# Patient Record
Sex: Female | Born: 1955 | Race: White | Hispanic: No | Marital: Married | Smoking: Never smoker
Health system: Southern US, Community
[De-identification: ages and names within clinical notes are randomized; demographics above are authoritative.]

---

## 2019-07-08 DIAGNOSIS — L718 Other rosacea: Secondary | ICD-10-CM | POA: Diagnosis not present

## 2019-07-08 DIAGNOSIS — D225 Melanocytic nevi of trunk: Secondary | ICD-10-CM | POA: Diagnosis not present

## 2019-07-08 DIAGNOSIS — Z85828 Personal history of other malignant neoplasm of skin: Secondary | ICD-10-CM | POA: Diagnosis not present

## 2019-07-08 DIAGNOSIS — B009 Herpesviral infection, unspecified: Secondary | ICD-10-CM | POA: Diagnosis not present

## 2019-07-08 DIAGNOSIS — L820 Inflamed seborrheic keratosis: Secondary | ICD-10-CM | POA: Diagnosis not present

## 2019-07-08 DIAGNOSIS — Z1283 Encounter for screening for malignant neoplasm of skin: Secondary | ICD-10-CM | POA: Diagnosis not present

## 2019-07-08 DIAGNOSIS — L249 Irritant contact dermatitis, unspecified cause: Secondary | ICD-10-CM | POA: Diagnosis not present

## 2019-07-08 DIAGNOSIS — D229 Melanocytic nevi, unspecified: Secondary | ICD-10-CM | POA: Diagnosis not present

## 2019-07-08 DIAGNOSIS — Z85820 Personal history of malignant melanoma of skin: Secondary | ICD-10-CM | POA: Diagnosis not present

## 2019-07-08 DIAGNOSIS — L578 Other skin changes due to chronic exposure to nonionizing radiation: Secondary | ICD-10-CM | POA: Diagnosis not present

## 2019-09-01 DIAGNOSIS — B0052 Herpesviral keratitis: Secondary | ICD-10-CM | POA: Diagnosis not present

## 2019-12-05 DIAGNOSIS — H524 Presbyopia: Secondary | ICD-10-CM | POA: Diagnosis not present

## 2019-12-05 DIAGNOSIS — H5213 Myopia, bilateral: Secondary | ICD-10-CM | POA: Diagnosis not present

## 2020-02-12 ENCOUNTER — Ambulatory Visit: Payer: Self-pay

## 2020-09-16 DIAGNOSIS — Z20822 Contact with and (suspected) exposure to covid-19: Secondary | ICD-10-CM | POA: Diagnosis not present

## 2021-05-26 DIAGNOSIS — M222X9 Patellofemoral disorders, unspecified knee: Secondary | ICD-10-CM | POA: Diagnosis not present

## 2021-05-26 DIAGNOSIS — S86811A Strain of other muscle(s) and tendon(s) at lower leg level, right leg, initial encounter: Secondary | ICD-10-CM | POA: Diagnosis not present

## 2021-10-20 DIAGNOSIS — H52213 Irregular astigmatism, bilateral: Secondary | ICD-10-CM | POA: Diagnosis not present

## 2021-10-20 DIAGNOSIS — H524 Presbyopia: Secondary | ICD-10-CM | POA: Diagnosis not present

## 2021-10-20 DIAGNOSIS — H5213 Myopia, bilateral: Secondary | ICD-10-CM | POA: Diagnosis not present

## 2022-04-21 DIAGNOSIS — H90A31 Mixed conductive and sensorineural hearing loss, unilateral, right ear with restricted hearing on the contralateral side: Secondary | ICD-10-CM | POA: Diagnosis not present

## 2022-04-21 DIAGNOSIS — H65111 Acute and subacute allergic otitis media (mucoid) (sanguinous) (serous), right ear: Secondary | ICD-10-CM | POA: Diagnosis not present

## 2022-04-21 DIAGNOSIS — H6122 Impacted cerumen, left ear: Secondary | ICD-10-CM | POA: Diagnosis not present

## 2022-05-15 ENCOUNTER — Emergency Department: Payer: 59

## 2022-05-15 ENCOUNTER — Emergency Department
Admission: EM | Admit: 2022-05-15 | Discharge: 2022-05-15 | Disposition: A | Discharge status: Home / Self Care | Payer: 59 | Attending: Emergency Medicine | Admitting: Emergency Medicine

## 2022-05-15 ENCOUNTER — Other Ambulatory Visit: Payer: Self-pay

## 2022-05-15 ENCOUNTER — Encounter: Payer: Self-pay | Admitting: Emergency Medicine

## 2022-05-15 DIAGNOSIS — R251 Tremor, unspecified: Secondary | ICD-10-CM | POA: Insufficient documentation

## 2022-05-15 DIAGNOSIS — R519 Headache, unspecified: Secondary | ICD-10-CM | POA: Diagnosis not present

## 2022-05-15 DIAGNOSIS — R531 Weakness: Secondary | ICD-10-CM | POA: Insufficient documentation

## 2022-05-15 DIAGNOSIS — R000 Tachycardia, unspecified: Secondary | ICD-10-CM | POA: Diagnosis not present

## 2022-05-15 LAB — URINALYSIS, ROUTINE W REFLEX MICROSCOPIC
Bilirubin Urine: NEGATIVE
Glucose, UA: NEGATIVE mg/dL
Ketones, ur: NEGATIVE mg/dL
Leukocytes,Ua: NEGATIVE
Nitrite: NEGATIVE
Protein, ur: NEGATIVE mg/dL
Specific Gravity, Urine: 1.005 (ref 1.005–1.030)
pH: 8 (ref 5.0–8.0)

## 2022-05-15 LAB — BASIC METABOLIC PANEL
Anion gap: 10 (ref 5–15)
BUN: 12 mg/dL (ref 8–23)
CO2: 23 mmol/L (ref 22–32)
Calcium: 9.1 mg/dL (ref 8.9–10.3)
Chloride: 104 mmol/L (ref 98–111)
Creatinine, Ser: 0.74 mg/dL (ref 0.44–1.00)
GFR, Estimated: >60 mL/min (ref >60)
Glucose, Bld: 154 mg/dL — ABNORMAL HIGH (ref 70–99)
Potassium: 3.8 mmol/L (ref 3.5–5.1)
Sodium: 137 mmol/L (ref 135–145)

## 2022-05-15 LAB — CBC
HCT: 46.8 % — ABNORMAL HIGH (ref 36.0–46.0)
Hemoglobin: 15.2 g/dL — ABNORMAL HIGH (ref 12.0–15.0)
MCH: 29.3 pg (ref 26.0–34.0)
MCHC: 32.5 g/dL (ref 30.0–36.0)
MCV: 90.2 fL (ref 80.0–100.0)
Platelets: 295 10*3/uL (ref 150–400)
RBC: 5.19 MIL/uL — ABNORMAL HIGH (ref 3.87–5.11)
RDW: 12.9 % (ref 11.5–15.5)
WBC: 5 10*3/uL (ref 4.0–10.5)
nRBC: 0 % (ref 0.0–0.2)

## 2022-05-15 LAB — HEPATIC FUNCTION PANEL
ALT: 21 U/L (ref 0–44)
AST: 21 U/L (ref 15–41)
Albumin: 4 g/dL (ref 3.5–5.0)
Alkaline Phosphatase: 48 U/L (ref 38–126)
Bilirubin, Direct: <0.1 mg/dL (ref 0.0–0.2)
Total Bilirubin: 0.7 mg/dL (ref 0.3–1.2)
Total Protein: 7 g/dL (ref 6.5–8.1)

## 2022-05-15 LAB — TSH: TSH: 2.924 u[IU]/mL (ref 0.350–4.500)

## 2022-05-15 MED ORDER — SODIUM CHLORIDE 0.9 % IV BOLUS
1000.0000 mL | Freq: Once | INTRAVENOUS | Status: AC
  Administered 2022-05-15: 1000 mL via INTRAVENOUS

## 2022-05-15 NOTE — ED Provider Notes (Signed)
[facility] Provider Note    Event Date/Time   First MD Initiated Contact with Patient 05/15/22 1434     (approximate)   History   Headache and Weakness   HPI  Ana Benson is a 66 y.o. female presents emergency department stating that she felt weak and had a headache this morning.  Went to the dentist last week and blood pressure was elevated.  No history of hypertension.  Did not take any medications for headache.  No blurred vision, facial droop, slurred speech, or loss of motor skills.  She denies chest pain or shortness of breath.      Physical Exam   Triage Vital Signs: ED Triage Vitals  Enc Vitals Group     BP 05/15/22 1050 (!) 156/90     Pulse Rate 05/15/22 1050 (!) 109     Resp 05/15/22 1050 18     Temp 05/15/22 1050 98.9 F (37.2 C)     Temp Source 05/15/22 1050 Oral     SpO2 05/15/22 1050 97 %     Weight 05/15/22 1038 150 lb (68 kg)     Height 05/15/22 1038 5\' 6"  (1.676 m)     Head Circumference --      Peak Flow --      Pain Score 05/15/22 1038 3     Pain Loc --      Pain Edu? --      Excl. in GC? --     Most recent vital signs: Vitals:   05/15/22 1438 05/15/22 1603  BP: (!) 163/95 (!) 147/80  Pulse: 87 82  Resp: 18 16  Temp:  98.1 F (36.7 C)  SpO2: 97% 95%     General: Awake, no distress.   CV:  Good peripheral perfusion. regular rate and  rhythm Resp:  Normal effort. Lungs CTA Abd:  No distention.   Other:  Patient has mild tremor in her hands bilaterally   ED Results / Procedures / Treatments   Labs (all labs ordered are listed, but only abnormal results are displayed) Labs Reviewed  BASIC METABOLIC PANEL - Abnormal; Notable for the following components:      Result Value   Glucose, Bld 154 (*)    All other components within normal limits  CBC - Abnormal; Notable for the following components:   RBC 5.19 (*)    Hemoglobin 15.2 (*)    HCT 46.8 (*)    All other components within normal limits  URINALYSIS,  ROUTINE W REFLEX MICROSCOPIC - Abnormal; Notable for the following components:   Color, Urine STRAW (*)    APPearance CLEAR (*)    Hgb urine dipstick SMALL (*)    Bacteria, UA RARE (*)    All other components within normal limits  TSH  HEPATIC FUNCTION PANEL     EKG  EKG   RADIOLOGY CT of the head    PROCEDURES:   Procedures   MEDICATIONS ORDERED IN ED: Medications  sodium chloride 0.9 % bolus 1,000 mL (0 mLs Intravenous Stopped 05/15/22 1631)     IMPRESSION / MDM / ASSESSMENT AND PLAN / ED COURSE  I reviewed the triage vital signs and the nursing notes.                              Differential diagnosis includes, but is not limited to, the duration, hypothyroid/hyperthyroidism, stroke, MI, dehydration  EKG shows sinus tachycardia, this may  be more indicative of dehydration  Patient's labs are reassuring, CBC, metabolic panel, hepatic panel are basically normal.  Patient does have a slight bump in her H&H which could indicate polycythemia or dehydration.  Patient's troponin is negative  CT of the head was interpreted by me as being negative.  Confirmed by radiology  Patient was given 1 L normal saline while here in the ED.  Patient states she is feeling much better and does not have a headache anymore.  She is to follow-up with her regular doctor.  Her blood pressure had returned to almost normal level and do not feel that we should start BP medications out of the emergency department.  She is to have her blood pressure rechecked this week, have her CBC rechecked in 1 week.  Return emergency department worsening.  Discharged in stable condition.     FINAL CLINICAL IMPRESSION(S) / ED DIAGNOSES   Final diagnoses:  Tremor of both hands  Weakness     Rx / DC Orders   ED Discharge Orders     None        Note:  This document was prepared using Dragon voice recognition software and may include unintentional dictation errors.    Faythe Ghee,  PA-C 05/15/22 1741    Merwyn Katos, MD 05/15/22 [phone]

## 2022-05-15 NOTE — ED Triage Notes (Signed)
Pt reports this am she felt weak and has a HA. Pt states she went to the dentist last week and her BP really high. Pt states no hx of HTN. Pt reports has not taken any meds for her headache. Denies blurred vision. No facial drooping.

## 2022-05-15 NOTE — Discharge Instructions (Addendum)
Follow-up with your regular doctor if not improving 3 days.  Return if worsening.  Drink plenty of fluids.  Avoid caffeinated drinks.

## 2022-05-17 ENCOUNTER — Encounter: Payer: Self-pay | Admitting: Physician Assistant

## 2022-05-17 ENCOUNTER — Ambulatory Visit: Payer: Self-pay | Admitting: Physician Assistant

## 2022-05-17 VITALS — BP 152/86 | HR 91 | Temp 97.6°F | Resp 12 | Ht 66.0 in | Wt 166.0 lb

## 2022-05-17 DIAGNOSIS — I10 Essential (primary) hypertension: Secondary | ICD-10-CM

## 2022-05-17 MED ORDER — LISINOPRIL 5 MG PO TABS: 5.0000 mg | ORAL_TABLET | Freq: Every day | ORAL | 3 refills | Status: DC

## 2022-05-17 NOTE — Progress Notes (Signed)
   Subjective: Hypertension    Patient ID: Ana Benson, female    DOB: October 24, 1956, 66 y.o.   MRN: [id number]  HPI Patient is a follow-up for emergency room 05/15/2022, where she was diagnosed with hypertension.  Patient was given extensive work-up including CT and lab test.  Patient states she continues to have elevated blood pressure reading with home unit.  Denies headache or fatigue which was noticed on initial presentation in emergency room.  Patient is alert she is no longer experiencing hand tremors.  No previous history of this complaint.   Review of Systems Negative except for complaint.    Objective:   Physical Exam  No acute distress. BP is 152/86, pulse 84, and patient 97% O2 sat on room air. HEENT was unremarkable.  Neck is supple without lymphadenectomy or bruits.  Lungs are clear to auscultation.  Heart regular rate and rhythm.     Assessment & Plan: Hypertension  Patient restarted 5 mg of lisinopril for 2 weeks.  Patient advised to record blood pressures daily until follow-up visit.  Return back to clinic if side effects of medication or if condition worsens.

## 2022-05-17 NOTE — Progress Notes (Signed)
Pt presents today for follow up on Elevated BP after ED visit. Pt stating she has a slight head ache and fatigued.

## 2022-05-31 ENCOUNTER — Encounter: Payer: Self-pay | Admitting: Physician Assistant

## 2022-05-31 ENCOUNTER — Ambulatory Visit: Payer: Self-pay | Admitting: Physician Assistant

## 2022-05-31 VITALS — BP 147/82 | HR 87 | Resp 12 | Wt 166.0 lb

## 2022-05-31 DIAGNOSIS — I10 Essential (primary) hypertension: Secondary | ICD-10-CM

## 2022-05-31 NOTE — Progress Notes (Signed)
Pt presents today for Elevated BP.

## 2022-05-31 NOTE — Progress Notes (Signed)
   Subjective: Hypertension    Patient ID: Ana Benson, female    DOB: 10-31-1956, 66 y.o.   MRN: [id number]  HPI Patient here today for 2-week follow-up status post starting lisinopril at 5 mg for hypertension.  Patient has recorded her blood pressure for the last 2 weeks average is 122/74.  Patient took her medication problems 1 hour prior to arrival.  States no side effects with taking medication.  Review of Systems Negative except for chief complaint    Objective:   Physical Exam  BP is 147/82, pulse 87, respiration 12, patient 90% O2 sat on room air.  Patient weighs 106 6 pounds and BMI is 26.79.      Assessment & Plan: Hypertension  Advise continue previous medication and follow-up in 6 months.

## 2022-07-08 IMAGING — CT CT HEAD W/O CM
4 series · 16 of 47 positions shown, 18 images · non-contrast
Comparison: None Available.

CLINICAL DATA: Headache.  Weakness.



[Series 2: head wo · axial · 0.43mm/px · z∈[-159,-34]mm · 7 of 35 slices shown, 9 images]
[im 5/35  brain]
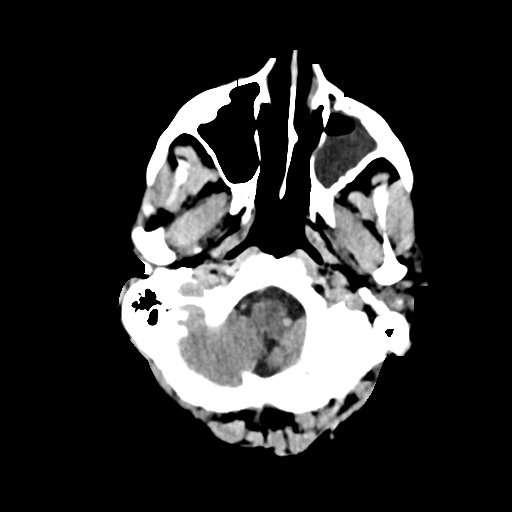
[im 5/35  bone]
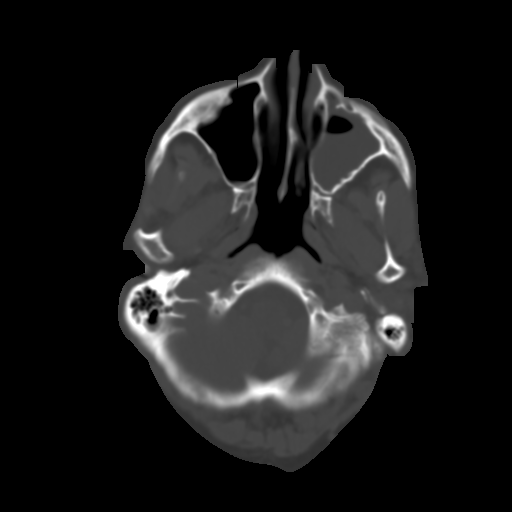
[im 9/35  brain]
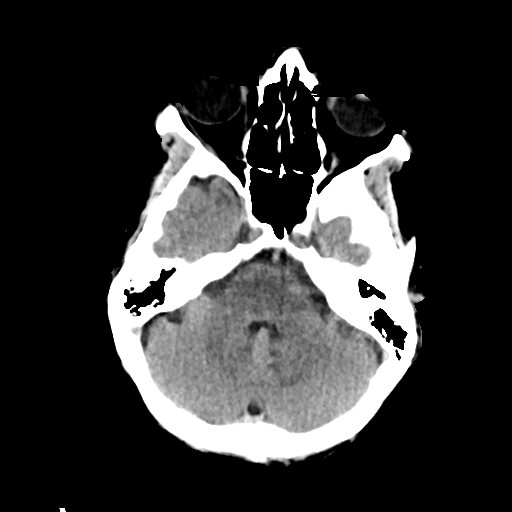
[im 13/35  brain]
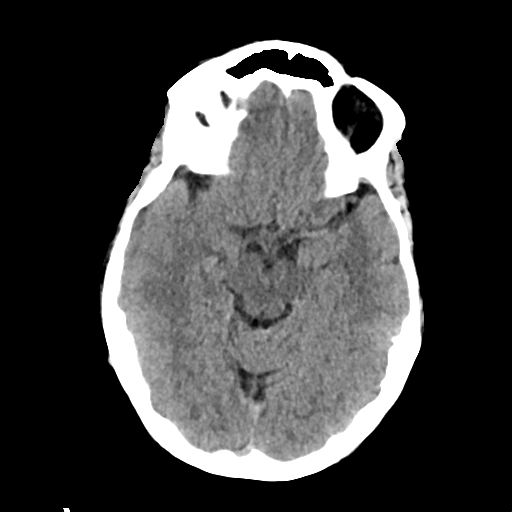
[im 18/35  brain]
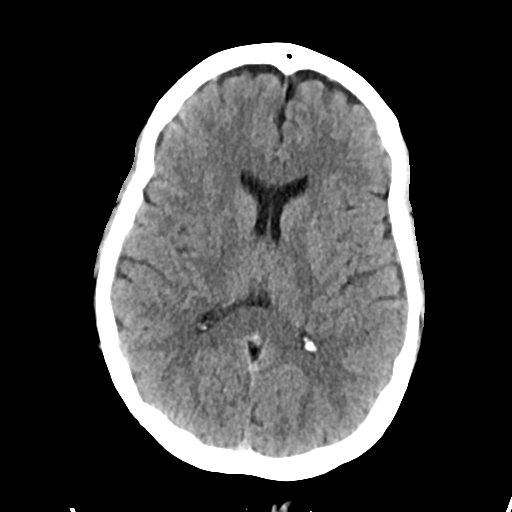
[im 22/35  brain]
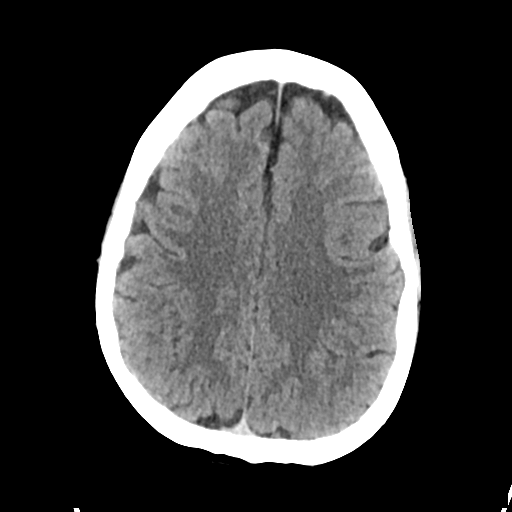
[im 22/35  bone]
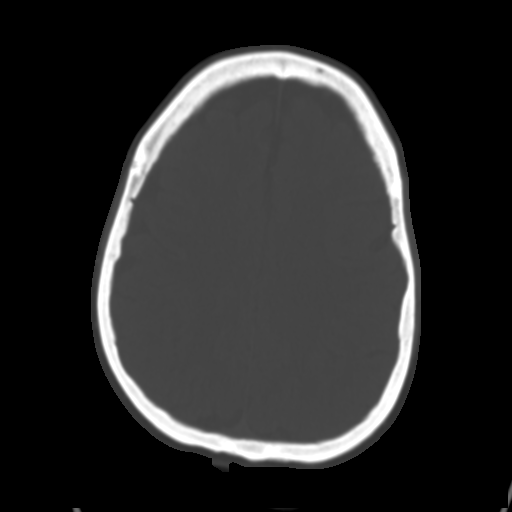
[im 26/35  brain]
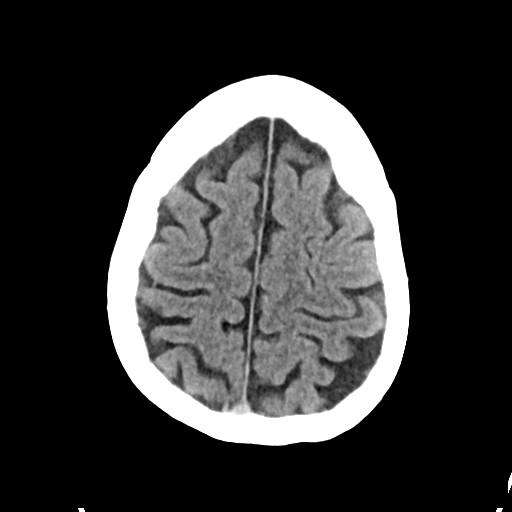
[im 30/35  brain]
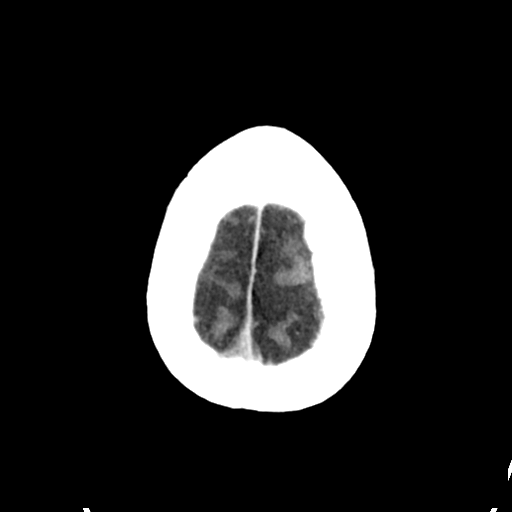

[Series 3: head bone · axial · 0.43mm/px · z∈[-163,-127]mm · 3 of 88 slices shown]
[im 9/88  bone]
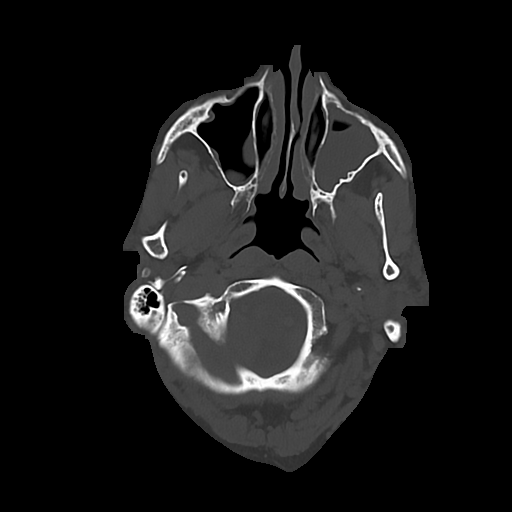
[im 18/88  bone]
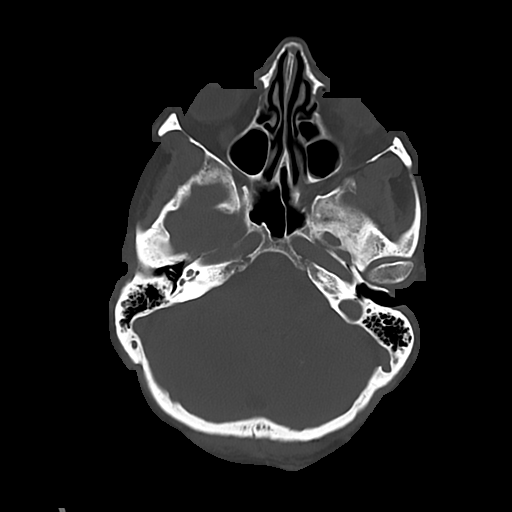
[im 27/88  bone]
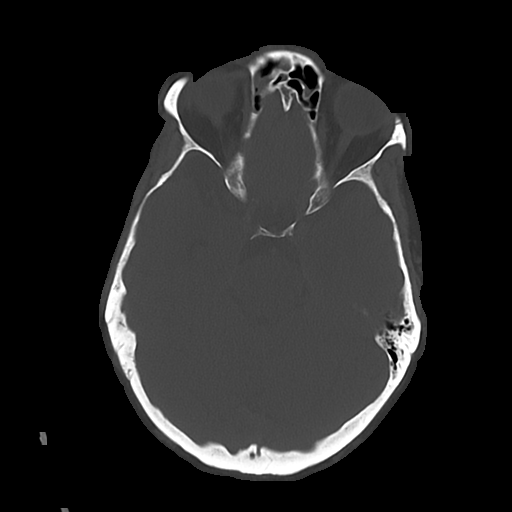

[Series 4: cor soft · coronal · 0.36mm/px · 3 of 77 slices shown]
[im 26/77  brain]
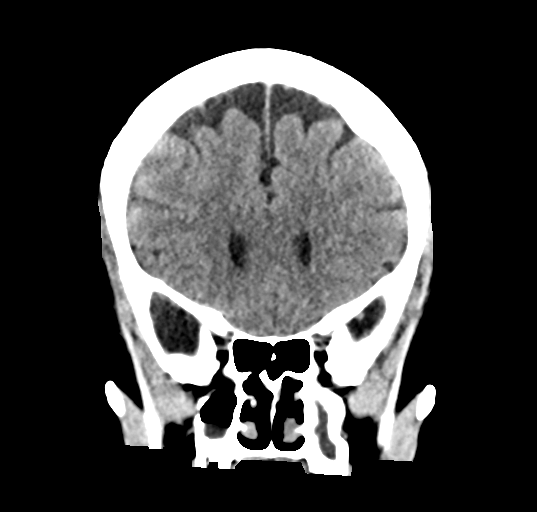
[im 34/77  brain]
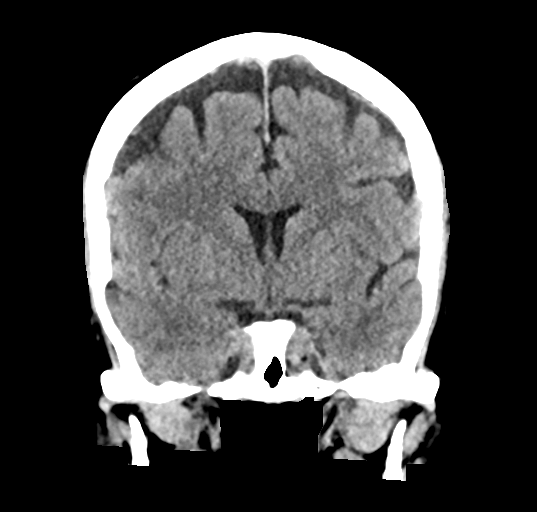
[im 43/77  brain]
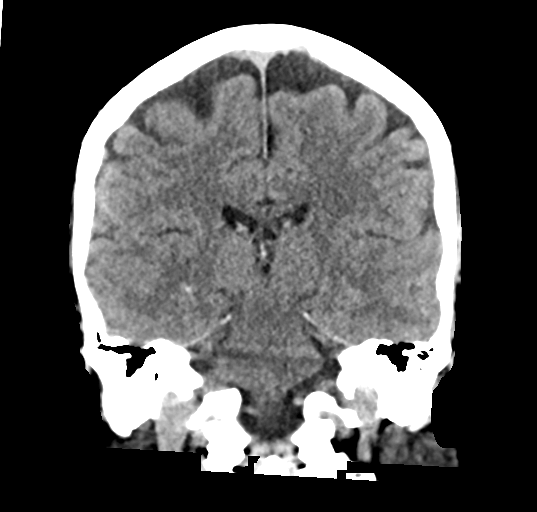

[Series 5: sag soft · sagittal · 0.38mm/px · 3 of 58 slices shown]
[im 20/58  brain]
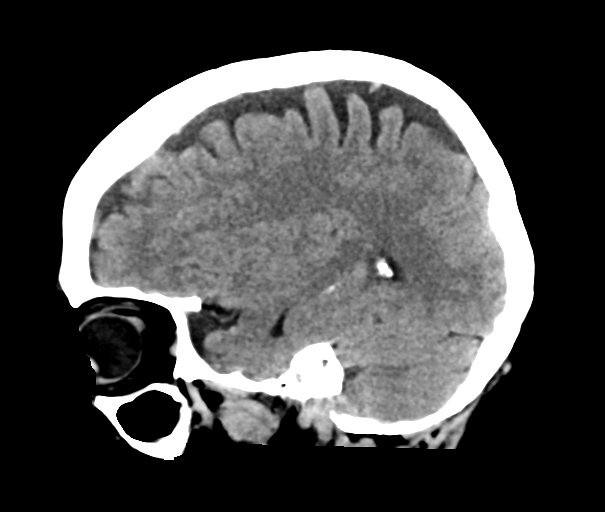
[im 29/58  brain]
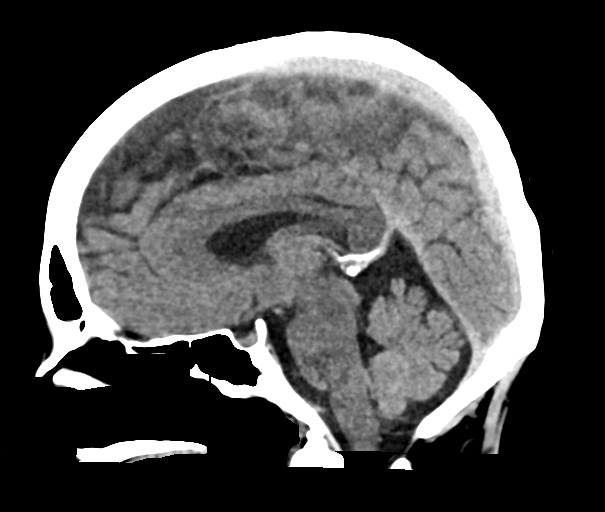
[im 39/58  brain]
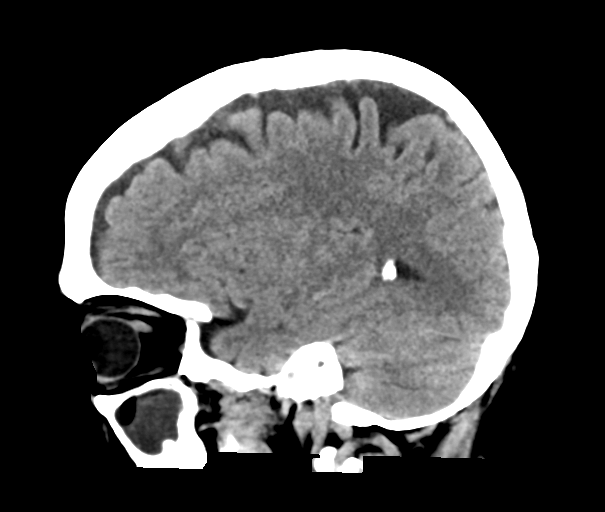

[16 of 47 positions shown; findings below may reference images not displayed]

FINDINGS: Brain: No evidence of acute infarction, hemorrhage, hydrocephalus,
extra-axial collection or mass lesion/mass effect.

Vascular: No hyperdense vessel or unexpected calcification.

Skull: Normal. Negative for fracture or focal lesion.

Sinuses/Orbits: Globes and orbits are unremarkable. Mucosal
thickening and dependent fluid mostly opacifies the left maxillary
sinus. Mild mucosal thickening noted in the right maxillary sinus.
Minor mucosal thickening along the inferior frontal sinuses and
anterior ethmoid air cells.

Other: None.
IMPRESSION: 1. No intracranial abnormalities.
2. Sinus disease most evident in the left maxillary sinus with there
is mucosal thickening and dependent fluid. Consider acute sinusitis
as the source of this patient's headache.

## 2023-02-09 ENCOUNTER — Other Ambulatory Visit: Payer: Self-pay

## 2023-02-09 DIAGNOSIS — I10 Essential (primary) hypertension: Secondary | ICD-10-CM

## 2023-02-12 MED ORDER — LISINOPRIL 5 MG PO TABS: 5.0000 mg | ORAL_TABLET | Freq: Every day | ORAL | 0 refills | Status: DC

## 2023-04-18 ENCOUNTER — Encounter: Payer: Self-pay | Admitting: Physician Assistant

## 2023-04-18 ENCOUNTER — Ambulatory Visit: Payer: Self-pay | Admitting: Physician Assistant

## 2023-04-18 VITALS — BP 151/71 | HR 96 | Temp 97.5°F | Resp 12 | Ht 66.0 in | Wt 159.0 lb

## 2023-04-18 DIAGNOSIS — I10 Essential (primary) hypertension: Secondary | ICD-10-CM | POA: Insufficient documentation

## 2023-04-18 MED ORDER — LISINOPRIL 10 MG PO TABS: 10.0000 mg | ORAL_TABLET | Freq: Every day | ORAL | 3 refills | Status: DC

## 2023-04-18 NOTE — Progress Notes (Signed)
   Subjective: Hypertension    Patient ID: Ana Benson, female    DOB: November 06, 1956, 67 y.o.   MRN: [id number]  HPI Patient presents for follow-up of hypertension.  Patient has been on lisinopril 5 mg daily since June 2023.  Patient 3-day blood pressure check shows increase of systolic blood pressure.   Review of Systems Negative except for above complaint    Objective:   Physical Exam BP 151/71  BP Location Left Arm  Patient Position Sitting  Cuff Size Large  Pulse 96  Resp 12  Temp 97.5 F (36.4 C)  Temp src Temporal  SpO2 98 %  Weight 159 lb (72.1 kg)  Height 5\' 6"  (1.676 m)  Physical exam deferred       Assessment & Plan: Hypertension  Discussed increasing lisinopril to 10 mg daily.  Follow-up next week for 3-day blood pressure check.

## 2023-09-11 DIAGNOSIS — H5213 Myopia, bilateral: Secondary | ICD-10-CM | POA: Diagnosis not present

## 2024-01-18 ENCOUNTER — Other Ambulatory Visit: Payer: Self-pay | Admitting: Physician Assistant

## 2024-02-27 DIAGNOSIS — D485 Neoplasm of uncertain behavior of skin: Secondary | ICD-10-CM | POA: Diagnosis not present

## 2024-02-27 DIAGNOSIS — D225 Melanocytic nevi of trunk: Secondary | ICD-10-CM | POA: Diagnosis not present

## 2024-02-27 DIAGNOSIS — L820 Inflamed seborrheic keratosis: Secondary | ICD-10-CM | POA: Diagnosis not present

## 2024-02-27 DIAGNOSIS — Z85820 Personal history of malignant melanoma of skin: Secondary | ICD-10-CM | POA: Diagnosis not present

## 2024-02-27 DIAGNOSIS — D2272 Melanocytic nevi of left lower limb, including hip: Secondary | ICD-10-CM | POA: Diagnosis not present

## 2024-02-27 DIAGNOSIS — L718 Other rosacea: Secondary | ICD-10-CM | POA: Diagnosis not present

## 2024-02-27 DIAGNOSIS — Z86006 Personal history of melanoma in-situ: Secondary | ICD-10-CM | POA: Diagnosis not present

## 2024-02-27 DIAGNOSIS — B001 Herpesviral vesicular dermatitis: Secondary | ICD-10-CM | POA: Diagnosis not present

## 2024-02-27 DIAGNOSIS — L821 Other seborrheic keratosis: Secondary | ICD-10-CM | POA: Diagnosis not present

## 2024-02-27 DIAGNOSIS — L570 Actinic keratosis: Secondary | ICD-10-CM | POA: Diagnosis not present

## 2024-02-27 DIAGNOSIS — D2261 Melanocytic nevi of right upper limb, including shoulder: Secondary | ICD-10-CM | POA: Diagnosis not present

## 2024-02-27 DIAGNOSIS — D2262 Melanocytic nevi of left upper limb, including shoulder: Secondary | ICD-10-CM | POA: Diagnosis not present

## 2024-04-10 ENCOUNTER — Ambulatory Visit: Payer: Self-pay | Admitting: Physician Assistant

## 2024-04-10 ENCOUNTER — Encounter: Payer: Self-pay | Admitting: Physician Assistant

## 2024-04-10 VITALS — BP 159/85 | HR 95 | Temp 98.2°F | Resp 16 | Ht 67.0 in | Wt 160.0 lb

## 2024-04-10 DIAGNOSIS — Z760 Encounter for issue of repeat prescription: Secondary | ICD-10-CM

## 2024-04-10 MED ORDER — LISINOPRIL 10 MG PO TABS: 10.0000 mg | ORAL_TABLET | Freq: Every day | ORAL | 3 refills | Status: AC

## 2024-04-10 NOTE — Progress Notes (Signed)
 Pt presents today for BP check an Refill on lisinopril .

## 2024-04-10 NOTE — Progress Notes (Signed)
   Subjective: Medication refill    Patient ID: Ana Benson, female    DOB: 18-Dec-1956, 68 y.o.   MRN: [id number]  HPI Patient presents for medication refill on lisinopril  for blood pressure control.  Patient voices no side effects of medication.   Review of Systems Hypertension    Objective:   Physical Exam BP 169/89BP. 169/89. Data is abnormal. Has comment. Taken on 04/10/24 1:45 PM 159/85BP. 159/85. Data is abnormal. Has comment. Taken on 04/10/24 1:49 PM  Cuff Size Normal Normal  Pulse Rate 94 95  Temp 98.2 F (36.8 C) --  Temp Source Oral --  Weight 160 lb (72.6 kg) --  Height 5\' 7"  (1.702 m) --  Resp 16 --  SpO2 98 % --  No acute distress.  Blood pressure is mildly elevated.       Assessment & Plan: Medication refill  Advised patient to do a 5-day home blood pressure check of her machine and then return for nurse visit for comparison.  Lisinopril  10 mg refilled but advised we might have to increase it based on her blood pressure readings.

## 2024-04-15 ENCOUNTER — Ambulatory Visit: Payer: Self-pay

## 2024-04-15 VITALS — BP 134/80 | HR 77

## 2024-04-15 DIAGNOSIS — Z0130 Encounter for examination of blood pressure without abnormal findings: Secondary | ICD-10-CM

## 2024-04-15 NOTE — Progress Notes (Signed)
 Pt presents today for first BP check: Pt was advised to keep arm elevated with heart when checking BP due to better readings. Ana Benson

## 2024-04-16 ENCOUNTER — Ambulatory Visit: Payer: Self-pay

## 2024-04-16 VITALS — BP 118/69 | HR 94

## 2024-04-16 DIAGNOSIS — Z0130 Encounter for examination of blood pressure without abnormal findings: Secondary | ICD-10-CM

## 2024-04-16 NOTE — Progress Notes (Signed)
 Pt presents today to for BP check.  At home BP meds was taking at 6:48am. BP check at 9:40am 128/73.  As of now BP reads 118/69

## 2024-04-16 NOTE — Addendum Note (Signed)
 Addended by: Marquette Sites F on: 04/16/2024 11:01 AM   Modules accepted: Orders

## 2024-04-17 ENCOUNTER — Ambulatory Visit: Payer: Self-pay | Admitting: Physician Assistant

## 2024-04-17 ENCOUNTER — Encounter: Payer: Self-pay | Admitting: Physician Assistant

## 2024-04-17 VITALS — BP 137/83 | HR 76 | Temp 97.1°F | Resp 14

## 2024-04-17 DIAGNOSIS — I10 Essential (primary) hypertension: Secondary | ICD-10-CM

## 2024-04-17 NOTE — Progress Notes (Signed)
 BP at home was 123/74

## 2024-04-17 NOTE — Progress Notes (Signed)
   Subjective: Hypertension    Patient ID: Kiren Coday, female    DOB: 11-May-1956, 68 y.o.   MRN: [id number]  HPI Patient follow-up for hypertension status post restarting lisinopril .  Patient was prescribed 10 mg to take daily.  Patient has noticed improvement of her daily blood pressure checks at home.  Denies any side effects.   Review of Systems Hypertension    Objective:   Physical Exam  04/17/2024   11:11 AM    BP 137/83  BP Location Left Arm  Patient Position Sitting  Cuff Size Normal  Pulse Rate 76  Temp 97.1 F (36.2 C)Temp. 97.1 F (36.2 C). Data is abnormal. Taken on 04/17/24 11:11 AM  Temp Source Temporal  Resp 14  SpO2 97 %  No acute distress. HEENT is unremarkable. Lungs clear to auscultation. Heart regular rate and rhythm.       Assessment & Plan: Hypertension  Advised to continue lisinopril  follow-up as needed.

## 2024-09-03 DIAGNOSIS — D040 Carcinoma in situ of skin of lip: Secondary | ICD-10-CM | POA: Diagnosis not present

## 2024-09-03 DIAGNOSIS — Z86006 Personal history of melanoma in-situ: Secondary | ICD-10-CM | POA: Diagnosis not present

## 2024-09-03 DIAGNOSIS — D2262 Melanocytic nevi of left upper limb, including shoulder: Secondary | ICD-10-CM | POA: Diagnosis not present

## 2024-09-03 DIAGNOSIS — D2261 Melanocytic nevi of right upper limb, including shoulder: Secondary | ICD-10-CM | POA: Diagnosis not present

## 2024-09-03 DIAGNOSIS — Z85820 Personal history of malignant melanoma of skin: Secondary | ICD-10-CM | POA: Diagnosis not present

## 2024-09-03 DIAGNOSIS — D2272 Melanocytic nevi of left lower limb, including hip: Secondary | ICD-10-CM | POA: Diagnosis not present

## 2024-09-03 DIAGNOSIS — D485 Neoplasm of uncertain behavior of skin: Secondary | ICD-10-CM | POA: Diagnosis not present

## 2024-09-03 DIAGNOSIS — D225 Melanocytic nevi of trunk: Secondary | ICD-10-CM | POA: Diagnosis not present

## 2024-10-13 ENCOUNTER — Telehealth: Payer: Self-pay

## 2024-11-03 DIAGNOSIS — D040 Carcinoma in situ of skin of lip: Secondary | ICD-10-CM | POA: Diagnosis not present
